# Patient Record
Sex: Female | Born: 1980 | Race: Black or African American | Hispanic: No | Smoking: Current every day smoker
Health system: Southern US, Community
[De-identification: ages and names within clinical notes are randomized; demographics above are authoritative.]

---

## 2021-08-23 ENCOUNTER — Emergency Department (HOSPITAL_COMMUNITY)
Admission: EM | Admit: 2021-08-23 | Discharge: 2021-08-24 | Disposition: A | Discharge status: Home / Self Care | Payer: Medicaid Other | Attending: Emergency Medicine | Admitting: Emergency Medicine

## 2021-08-23 DIAGNOSIS — I959 Hypotension, unspecified: Secondary | ICD-10-CM | POA: Insufficient documentation

## 2021-08-23 DIAGNOSIS — N9489 Other specified conditions associated with female genital organs and menstrual cycle: Secondary | ICD-10-CM | POA: Insufficient documentation

## 2021-08-23 DIAGNOSIS — Z20822 Contact with and (suspected) exposure to covid-19: Secondary | ICD-10-CM | POA: Insufficient documentation

## 2021-08-23 DIAGNOSIS — R0789 Other chest pain: Secondary | ICD-10-CM | POA: Insufficient documentation

## 2021-08-23 DIAGNOSIS — R079 Chest pain, unspecified: Secondary | ICD-10-CM

## 2021-08-23 DIAGNOSIS — R0602 Shortness of breath: Secondary | ICD-10-CM | POA: Insufficient documentation

## 2021-08-23 DIAGNOSIS — D72829 Elevated white blood cell count, unspecified: Secondary | ICD-10-CM | POA: Insufficient documentation

## 2021-08-24 ENCOUNTER — Encounter (HOSPITAL_COMMUNITY): Payer: Self-pay | Admitting: *Deleted

## 2021-08-24 ENCOUNTER — Emergency Department (HOSPITAL_COMMUNITY): Payer: Medicaid Other

## 2021-08-24 ENCOUNTER — Other Ambulatory Visit: Payer: Self-pay

## 2021-08-24 LAB — TROPONIN I (HIGH SENSITIVITY)
Troponin I (High Sensitivity): 3 ng/L (ref <18)
Troponin I (High Sensitivity): 3 ng/L (ref <18)

## 2021-08-24 LAB — CBC WITH DIFFERENTIAL/PLATELET
Abs Immature Granulocytes: 0.06 10*3/uL (ref 0.00–0.07)
Basophils Absolute: 0 10*3/uL (ref 0.0–0.1)
Basophils Relative: 0 %
Eosinophils Absolute: 0 10*3/uL (ref 0.0–0.5)
Eosinophils Relative: 0 %
HCT: 33.5 % — ABNORMAL LOW (ref 36.0–46.0)
Hemoglobin: 10.7 g/dL — ABNORMAL LOW (ref 12.0–15.0)
Immature Granulocytes: 0 %
Lymphocytes Relative: 21 %
Lymphs Abs: 3.5 10*3/uL (ref 0.7–4.0)
MCH: 22.5 pg — ABNORMAL LOW (ref 26.0–34.0)
MCHC: 31.9 g/dL (ref 30.0–36.0)
MCV: 70.4 fL — ABNORMAL LOW (ref 80.0–100.0)
Monocytes Absolute: 0.8 10*3/uL (ref 0.1–1.0)
Monocytes Relative: 4 %
Neutro Abs: 12.7 10*3/uL — ABNORMAL HIGH (ref 1.7–7.7)
Neutrophils Relative %: 75 %
Platelets: 303 10*3/uL (ref 150–400)
RBC: 4.76 MIL/uL (ref 3.87–5.11)
RDW: 16.1 % — ABNORMAL HIGH (ref 11.5–15.5)
WBC: 17 10*3/uL — ABNORMAL HIGH (ref 4.0–10.5)
nRBC: 0 % (ref 0.0–0.2)

## 2021-08-24 LAB — COMPREHENSIVE METABOLIC PANEL
ALT: 10 U/L (ref 0–44)
AST: 19 U/L (ref 15–41)
Albumin: 3.9 g/dL (ref 3.5–5.0)
Alkaline Phosphatase: 63 U/L (ref 38–126)
Anion gap: 8 (ref 5–15)
BUN: 18 mg/dL (ref 6–20)
CO2: 23 mmol/L (ref 22–32)
Calcium: 8.8 mg/dL — ABNORMAL LOW (ref 8.9–10.3)
Chloride: 104 mmol/L (ref 98–111)
Creatinine, Ser: 0.69 mg/dL (ref 0.44–1.00)
GFR, Estimated: >60 mL/min (ref >60)
Glucose, Bld: 87 mg/dL (ref 70–99)
Potassium: 3.7 mmol/L (ref 3.5–5.1)
Sodium: 135 mmol/L (ref 135–145)
Total Bilirubin: 0.7 mg/dL (ref 0.3–1.2)
Total Protein: 6.9 g/dL (ref 6.5–8.1)

## 2021-08-24 LAB — RESP PANEL BY RT-PCR (FLU A&B, COVID) ARPGX2
Influenza A by PCR: NEGATIVE
Influenza B by PCR: NEGATIVE
SARS Coronavirus 2 by RT PCR: NEGATIVE

## 2021-08-24 LAB — I-STAT BETA HCG BLOOD, ED (MC, WL, AP ONLY): I-stat hCG, quantitative: <5 m[IU]/mL (ref <5)

## 2021-08-24 NOTE — ED Notes (Signed)
Pt requesting to leave. States intermittent sharp pain in her chest. Only complaint at the moment is hunger. Given [place] sandwich per MD request.

## 2021-08-24 NOTE — ED Provider Notes (Signed)
[facility] EMERGENCY DEPARTMENT Provider Note   CSN: [id number] Arrival date & time: 08/23/21  2357     History Chief Complaint  Patient presents with   Chest Pain    Lindsay Duffy is a 40 y.o. female.   Chest Pain  Patient presents to the ED for evaluation of sharp left-sided chest pain.  Patient's symptoms initially started a couple hours before she arrived to the ED last evening.  He was on the left side and did not radiate.  She did have some shortness of breath but no nausea vomiting cough congestion or fever.  Patient states her symptoms have improved during her wait to be evaluated.  She has not had any abdominal pain.  No trouble with dysuria.  She denies any numbness or weakness.  She is currently not feeling short of breath.  History reviewed. No pertinent past medical history.  There are no problems to display for this patient.   History reviewed. No pertinent surgical history.   OB History   No obstetric history on file.     No family history on file.     Home Medications Prior to Admission medications   Not on File    Allergies    Patient has no known allergies.  Review of Systems   Review of Systems  Cardiovascular:  Positive for chest pain.  All other systems reviewed and are negative.  Physical Exam Updated Vital Signs BP 104/76   Pulse 67   Temp 98.1 F (36.7 C)   Resp 16   Ht 1.626 m (5\' 4" )   Wt 63.5 kg   LMP 08/24/2021   SpO2 97%   BMI 24.03 kg/m   Physical Exam Vitals and nursing note reviewed.  Constitutional:      General: She is not in acute distress.    Appearance: She is well-developed.  HENT:     Head: Normocephalic and atraumatic.     Right Ear: External ear normal.     Left Ear: External ear normal.  Eyes:     General: No scleral icterus.       Right eye: No discharge.        Left eye: No discharge.     Conjunctiva/sclera: Conjunctivae normal.  Neck:     Trachea: No tracheal deviation.   Cardiovascular:     Rate and Rhythm: Normal rate and regular rhythm.  Pulmonary:     Effort: Pulmonary effort is normal. No respiratory distress.     Breath sounds: Normal breath sounds. No stridor. No wheezing or rales.  Abdominal:     General: Bowel sounds are normal. There is no distension.     Palpations: Abdomen is soft.     Tenderness: There is no abdominal tenderness. There is no guarding or rebound.  Musculoskeletal:        General: No tenderness or deformity.     Cervical back: Neck supple.  Skin:    General: Skin is warm and dry.     Findings: No rash.  Neurological:     General: No focal deficit present.     Mental Status: She is alert.     Cranial Nerves: No cranial nerve deficit (no facial droop, extraocular movements intact, no slurred speech).     Sensory: No sensory deficit.     Motor: No abnormal muscle tone or seizure activity.     Coordination: Coordination normal.  Psychiatric:        Mood and Affect: Mood normal.  ED Results / Procedures / Treatments   Labs (all labs ordered are listed, but only abnormal results are displayed) Labs Reviewed  CBC WITH DIFFERENTIAL/PLATELET - Abnormal; Notable for the following components:      Result Value   WBC 17.0 (*)    Hemoglobin 10.7 (*)    HCT 33.5 (*)    MCV 70.4 (*)    MCH 22.5 (*)    RDW 16.1 (*)    Neutro Abs 12.7 (*)    All other components within normal limits  COMPREHENSIVE METABOLIC PANEL - Abnormal; Notable for the following components:   Calcium 8.8 (*)    All other components within normal limits  RESP PANEL BY RT-PCR (FLU A&B, COVID) [id number]  I-STAT BETA HCG BLOOD, ED ([facility], [facility], [facility] ONLY)  TROPONIN I (HIGH SENSITIVITY)  TROPONIN I (HIGH SENSITIVITY)    EKG EKG Interpretation  Date/Time:  Monday August 24 2021 00:06:15 EST Ventricular Rate:  69 PR Interval:  148 QRS Duration: 82 QT Interval:  414 QTC Calculation: 443 R Axis:   87 Text Interpretation: Normal sinus rhythm Artifact  Otherwise within normal limits Confirmed by Gerhard Munch [phone]) on 08/24/2021 8:24:09 AM  Radiology DG Chest 2 View  Result Date: 08/24/2021 CLINICAL DATA:  Chest pain. EXAM: CHEST - 2 VIEW COMPARISON:  None. FINDINGS: The heart size and mediastinal contours are within normal limits. There is mild hyperinflation of the lungs with coarse interstitial markings. No consolidation, effusion, or pneumothorax. No acute osseous abnormality. IMPRESSION: Hyperinflation of the lungs with no acute process. Electronically Signed   By: Thornell Sartorius M.D.   On: 08/24/2021 01:17    Procedures Procedures   Medications Ordered in ED Medications - No data to display  ED Course  I have reviewed the triage vital signs and the nursing notes.  Pertinent labs & imaging results that were available during my care of the patient were reviewed by me and considered in my medical decision making (see chart for details).    MDM Rules/Calculators/A&P                           Patient presented with complaints of chest pain.  Concern for the possibility of acute coronary syndrome.  Also did consider pneumonia or pulmonary embolism.  Symptoms not suggestive of dissection although still in the differential.  Initial ED work-up prior to my evaluation including laboratory tests as well as x-ray.  Patient does not have any evidence of pneumonia on chest x-ray.  COVID and flu are negative.  Metabolic panel unremarkable.  Patient did have leukocytosis but no obvious signs of infection otherwise.  Serial troponins were negative.  No signs of cardiac ischemia.  Patient's blood pressure was noted to be low this morning.  Discussed my concern about the possibility of pulmonary embolism causing the sharp chest pain and her low blood pressure.  I recommend doing a CT angiogram of the lungs.  I explained my concern about her blood pressure and the possibility of a pulmonary embolism causing her pain and discomfort.  Patient states she  is feeling fine and does not feel that that is necessary.  She states her blood pressure is always low.  I did review previous records from other medical facilities.  On October 7 at [facility] she did have a blood pressure documented at 98/61 and on September 26 it was 93/60.   Final Clinical Impression(s) / ED Diagnoses Final diagnoses:  Chest pain, unspecified type  Hypotension, unspecified hypotension type  Leukocytosis, unspecified type    Rx / DC Orders ED Discharge Orders     None        Linwood Dibbles, MD 08/24/21 [phone]

## 2021-08-24 NOTE — ED Triage Notes (Signed)
The pt arrived by ems from home pt c/o some chest pain  2-3 hours.  Mi in the past year no blood thinners

## 2021-08-24 NOTE — ED Notes (Signed)
Patient Alert and oriented to baseline. Stable and ambulatory to baseline. Patient verbalized understanding of the discharge instructions.  Patient belongings were taken by the patient.   

## 2021-08-24 NOTE — Discharge Instructions (Addendum)
Return to the ER if the symptoms are not improving or if you change your mind about getting the scan that we discussed.  Follow-up with your doctor to be rechecked if the symptoms do not completely resolve within the next couple of days

## 2021-08-24 NOTE — ED Provider Notes (Signed)
MSE was initiated and I personally evaluated the patient and placed orders (if any) at  12:08 AM on August 24, 2021.  Patient with no significant medical history reported to ED by EMS for 2 hours of sharp, left, nonradiating CP, getting progressively worse since onset. Associated with SOB, but no N, V, cough, congestion, fever. She reports sore throat.   RRR Lungs clear No chest wall tenderness  The patient appears stable so that the remainder of the MSE may be completed by another provider.   Elpidio Anis, PA-C 08/24/21 0010    Tilden Fossa, MD 08/24/21 [phone]

## 2021-08-30 ENCOUNTER — Emergency Department (HOSPITAL_COMMUNITY): Payer: 59

## 2021-08-30 ENCOUNTER — Inpatient Hospital Stay (HOSPITAL_COMMUNITY)
Admission: EM | Admit: 2021-08-30 | Discharge: 2021-09-01 | DRG: 176 | Disposition: A | Discharge status: Home / Self Care | Payer: 59 | Attending: Internal Medicine | Admitting: Internal Medicine

## 2021-08-30 ENCOUNTER — Encounter (HOSPITAL_COMMUNITY): Payer: Self-pay

## 2021-08-30 ENCOUNTER — Other Ambulatory Visit: Payer: Self-pay

## 2021-08-30 DIAGNOSIS — I2602 Saddle embolus of pulmonary artery with acute cor pulmonale: Secondary | ICD-10-CM | POA: Diagnosis not present

## 2021-08-30 DIAGNOSIS — Z5900 Homelessness unspecified: Secondary | ICD-10-CM | POA: Diagnosis not present

## 2021-08-30 DIAGNOSIS — I2699 Other pulmonary embolism without acute cor pulmonale: Secondary | ICD-10-CM | POA: Diagnosis present

## 2021-08-30 DIAGNOSIS — Z8616 Personal history of COVID-19: Secondary | ICD-10-CM

## 2021-08-30 DIAGNOSIS — R918 Other nonspecific abnormal finding of lung field: Secondary | ICD-10-CM | POA: Diagnosis present

## 2021-08-30 DIAGNOSIS — D62 Acute posthemorrhagic anemia: Secondary | ICD-10-CM | POA: Diagnosis present

## 2021-08-30 DIAGNOSIS — F17210 Nicotine dependence, cigarettes, uncomplicated: Secondary | ICD-10-CM | POA: Diagnosis present

## 2021-08-30 DIAGNOSIS — I2694 Multiple subsegmental pulmonary emboli without acute cor pulmonale: Secondary | ICD-10-CM | POA: Diagnosis present

## 2021-08-30 DIAGNOSIS — E861 Hypovolemia: Secondary | ICD-10-CM | POA: Diagnosis present

## 2021-08-30 DIAGNOSIS — E871 Hypo-osmolality and hyponatremia: Secondary | ICD-10-CM | POA: Diagnosis present

## 2021-08-30 DIAGNOSIS — Z825 Family history of asthma and other chronic lower respiratory diseases: Secondary | ICD-10-CM | POA: Diagnosis not present

## 2021-08-30 LAB — RESP PANEL BY RT-PCR (FLU A&B, COVID) ARPGX2
Influenza A by PCR: NEGATIVE
Influenza B by PCR: NEGATIVE
SARS Coronavirus 2 by RT PCR: NEGATIVE

## 2021-08-30 LAB — CBC
HCT: 35.5 % — ABNORMAL LOW (ref 36.0–46.0)
Hemoglobin: 11.4 g/dL — ABNORMAL LOW (ref 12.0–15.0)
MCH: 22.8 pg — ABNORMAL LOW (ref 26.0–34.0)
MCHC: 32.1 g/dL (ref 30.0–36.0)
MCV: 70.9 fL — ABNORMAL LOW (ref 80.0–100.0)
Platelets: 317 10*3/uL (ref 150–400)
RBC: 5.01 MIL/uL (ref 3.87–5.11)
RDW: 16.4 % — ABNORMAL HIGH (ref 11.5–15.5)
WBC: 11.7 10*3/uL — ABNORMAL HIGH (ref 4.0–10.5)
nRBC: 0 % (ref 0.0–0.2)

## 2021-08-30 LAB — TROPONIN I (HIGH SENSITIVITY): Troponin I (High Sensitivity): 4 ng/L (ref <18)

## 2021-08-30 LAB — COMPREHENSIVE METABOLIC PANEL
ALT: 15 U/L (ref 0–44)
AST: 25 U/L (ref 15–41)
Albumin: 4.8 g/dL (ref 3.5–5.0)
Alkaline Phosphatase: 72 U/L (ref 38–126)
Anion gap: 9 (ref 5–15)
BUN: 29 mg/dL — ABNORMAL HIGH (ref 6–20)
CO2: 20 mmol/L — ABNORMAL LOW (ref 22–32)
Calcium: 9.1 mg/dL (ref 8.9–10.3)
Chloride: 106 mmol/L (ref 98–111)
Creatinine, Ser: 0.78 mg/dL (ref 0.44–1.00)
GFR, Estimated: >60 mL/min (ref >60)
Glucose, Bld: 100 mg/dL — ABNORMAL HIGH (ref 70–99)
Potassium: 3.7 mmol/L (ref 3.5–5.1)
Sodium: 135 mmol/L (ref 135–145)
Total Bilirubin: 1.2 mg/dL (ref 0.3–1.2)
Total Protein: 7.9 g/dL (ref 6.5–8.1)

## 2021-08-30 LAB — I-STAT BETA HCG BLOOD, ED (MC, WL, AP ONLY): I-stat hCG, quantitative: <5 m[IU]/mL (ref <5)

## 2021-08-30 MED ORDER — THIAMINE HCL 100 MG/ML IJ SOLN: 100.0000 mg | Freq: Every day | INTRAMUSCULAR | Status: DC

## 2021-08-30 MED ORDER — ALBUTEROL SULFATE HFA 108 (90 BASE) MCG/ACT IN AERS
2.0000 | INHALATION_SPRAY | RESPIRATORY_TRACT | Status: DC | PRN
  Administered 2021-08-30: 13:00:00 2 via RESPIRATORY_TRACT
  Filled 2021-08-30: qty 6.7

## 2021-08-30 MED ORDER — ENOXAPARIN SODIUM 80 MG/0.8ML IJ SOSY
1.0000 mg/kg | PREFILLED_SYRINGE | Freq: Two times a day (BID) | INTRAMUSCULAR | Status: DC
Start: 2021-08-31 — End: 2021-09-01
  Administered 2021-08-31 (×2): 65 mg via SUBCUTANEOUS
  Filled 2021-08-30: qty 0.65
  Filled 2021-08-30: qty 0.8
  Filled 2021-08-30 (×2): qty 0.65

## 2021-08-30 MED ORDER — THIAMINE HCL 100 MG PO TABS
100.0000 mg | ORAL_TABLET | Freq: Every day | ORAL | Status: DC
  Administered 2021-08-30 – 2021-08-31 (×2): 100 mg via ORAL
  Filled 2021-08-30 (×2): qty 1

## 2021-08-30 MED ORDER — SODIUM CHLORIDE 0.9 % IV SOLN: INTRAVENOUS | Status: DC

## 2021-08-30 MED ORDER — DIAZEPAM 5 MG PO TABS
10.0000 mg | ORAL_TABLET | Freq: Two times a day (BID) | ORAL | Status: DC
  Administered 2021-09-01: 10 mg via ORAL
  Filled 2021-08-30: qty 2

## 2021-08-30 MED ORDER — LORAZEPAM 1 MG PO TABS: 1.0000 mg | ORAL_TABLET | ORAL | Status: DC | PRN

## 2021-08-30 MED ORDER — GUAIFENESIN 100 MG/5ML PO LIQD: 5.0000 mL | ORAL | Status: DC | PRN

## 2021-08-30 MED ORDER — LORAZEPAM 2 MG/ML IJ SOLN: 1.0000 mg | INTRAMUSCULAR | Status: DC | PRN

## 2021-08-30 MED ORDER — NICOTINE 14 MG/24HR TD PT24
14.0000 mg | MEDICATED_PATCH | Freq: Every day | TRANSDERMAL | Status: DC
  Administered 2021-08-31: 14 mg via TRANSDERMAL
  Filled 2021-08-30: qty 1

## 2021-08-30 MED ORDER — IOHEXOL 350 MG/ML SOLN
80.0000 mL | Freq: Once | INTRAVENOUS | Status: AC | PRN
  Administered 2021-08-30: 16:00:00 80 mL via INTRAVENOUS

## 2021-08-30 MED ORDER — ENOXAPARIN SODIUM 80 MG/0.8ML IJ SOSY
1.0000 mg/kg | PREFILLED_SYRINGE | Freq: Once | INTRAMUSCULAR | Status: AC
  Administered 2021-08-30: 19:00:00 65 mg via SUBCUTANEOUS
  Filled 2021-08-30: qty 0.65

## 2021-08-30 MED ORDER — ADULT MULTIVITAMIN W/MINERALS CH
1.0000 | ORAL_TABLET | Freq: Every day | ORAL | Status: DC
  Administered 2021-08-30 – 2021-08-31 (×2): 1 via ORAL
  Filled 2021-08-30 (×2): qty 1

## 2021-08-30 MED ORDER — FOLIC ACID 1 MG PO TABS
1.0000 mg | ORAL_TABLET | Freq: Every day | ORAL | Status: DC
  Administered 2021-08-30 – 2021-08-31 (×2): 1 mg via ORAL
  Filled 2021-08-30 (×2): qty 1

## 2021-08-30 MED ORDER — DIAZEPAM 5 MG PO TABS: 10.0000 mg | ORAL_TABLET | Freq: Three times a day (TID) | ORAL | Status: DC

## 2021-08-30 MED ORDER — TRAZODONE HCL 50 MG PO TABS: 50.0000 mg | ORAL_TABLET | Freq: Every evening | ORAL | Status: DC | PRN

## 2021-08-30 MED ORDER — DIAZEPAM 5 MG PO TABS
10.0000 mg | ORAL_TABLET | Freq: Three times a day (TID) | ORAL | Status: AC
  Administered 2021-08-30 – 2021-08-31 (×4): 10 mg via ORAL
  Filled 2021-08-30 (×4): qty 2

## 2021-08-30 MED ORDER — IPRATROPIUM-ALBUTEROL 0.5-2.5 (3) MG/3ML IN SOLN
3.0000 mL | Freq: Two times a day (BID) | RESPIRATORY_TRACT | Status: DC
  Filled 2021-08-30 (×2): qty 3

## 2021-08-30 MED ORDER — ACETAMINOPHEN 325 MG PO TABS: 650.0000 mg | ORAL_TABLET | Freq: Four times a day (QID) | ORAL | Status: DC | PRN
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
## 2021-08-30 NOTE — Progress Notes (Signed)
TOC CSW attempted to contact [facility]/WS [phone].  [facility] is currently close will open for services on Monday (08/31/2021).  Lindsay Duffy, MSW, LCSW-A Pronouns:  She/Her/Hers [facility] Clinical Social Worker Direct Number:  [phone] [email or web address]

## 2021-08-30 NOTE — Progress Notes (Signed)
ANTICOAGULATION CONSULT NOTE - Initial Consult  Pharmacy Consult for IV heparin Indication: pulmonary embolus  No Known Allergies  Patient Measurements: Height: 5\' 4"  (162.6 cm) Weight: 64 kg (141 lb) IBW/kg (Calculated) : 54.7 Heparin Dosing Weight: 64 kg  Vital Signs: BP: 102/65 (12/04 1704) Pulse Rate: 54 (12/04 1704)  Labs: Recent Labs    08/30/21 0919  HGB 11.4*  HCT 35.5*  PLT 317  CREATININE 0.78    Estimated Creatinine Clearance: 79.9 mL/min (by C-G formula based on SCr of 0.78 mg/dL).   Medical History: History reviewed. No pertinent past medical history.  Medications:  Scheduled:  Infusions:  PRN: albuterol  Assessment: 40 yo female with CC shortness of breath found to have PE to start IV heparin. Baseline labs drawn.   Goal of Therapy:  Heparin level 0.3-0.7 units/ml Monitor platelets by anticoagulation protocol: Yes   Plan:  IV Heparin 2000 unit bolus then IV Heparin rate of 1150 units/hr Check heparin level 6 hours after start of IV heparin Daily CBC  46 08/30/2021,6:20 PM

## 2021-08-30 NOTE — ED Triage Notes (Signed)
Patient BIB [facility] complaining of shortness of breath the past hour. Patient has COPD.

## 2021-08-30 NOTE — H&P (Signed)
History and Physical  Lindsay Duffy ZOX:[id number] DOB: 07/24/1981 DOA: 08/30/2021  Referring physician: Dr. Achille Rich, EDP  PCP: Pcp, No  Outpatient Specialists: None. Patient coming from: Homeless  Chief Complaint: Shortness of breath  HPI: Lindsay Duffy is a 40 y.o. female with medical history significant for ongoing tobacco use disorder, ongoing alcohol use disorder, COPD, homelessness who presented to [facility] ED with complaints of sudden onset worsening shortness of breath x1 day duration.  Reports she has had prior episodes of shortness of breath but not as severe.  Associated with productive cough with yellow sputum.  No hemoptysis.  Endorses pleuritic left-sided chest pain worse with taking a deep breath.  Recently diagnosed with COVID-19 viral infection 6 weeks ago after she arrived in [place] after a 5 hours trip on the bus from [place].  States she got out of the bus once during that 5-hour trip to smoke, at the 2.5-hour mark.  She smokes cigarettes about a pack a day.  Not on hormonal birth control.  Currently on her menstrual period.  Has noted mild edema in her lower extremities bilaterally, states her socks feel tighter.  Denies fevers or chills.  Upon presentation to the ED, work-up revealed multiple pulmonary emboli and emphysema.  EDP requested admission for further evaluation and management of present condition.  Admitted by hospitalist service.  ED Course: T-max 98.5.  BP 83/64 pulse 64, respiration rate 17, oxygen saturation 100% on room air.  Review of Systems: Review of systems as noted in the HPI. All other systems reviewed and are negative.  Past medical history: COPD.  Past surgical history: None reported.  Social history: Tobacco use, 1 pack/day. Drinks 1 pint of liquor and beers daily, last alcoholic drink was last night 81/09/9145. Denies use of illicit drugs.   No Known Allergies  Family history: Father with history of COPD and asthma.  Prior to  admission medications: None  Physical Exam: BP 102/65   Pulse (!) 54   Temp 98.5 F (36.9 C) (Oral)   Resp 16   Ht 5\' 4"  (1.626 m)   Wt 64 kg   LMP 08/24/2021   SpO2 100%   BMI 24.20 kg/m   General: 40 y.o. year-old female well developed well nourished in no acute distress.  Alert and oriented x3. Cardiovascular: Regular rate and rhythm with no rubs or gallops.  No thyromegaly or JVD noted.  Trace lower extremity edema. 2/4 pulses in all 4 extremities. Respiratory: Clear to auscultation with no wheezes or rales. Good inspiratory effort.  Left-sided pleuritic pain but deep breath. Abdomen: Soft nontender nondistended with normal bowel sounds x4 quadrants. Muskuloskeletal: No cyanosis or clubbing.  Trace edema noted bilaterally Neuro: CN II-XII intact, strength, sensation, reflexes Skin: No ulcerative lesions noted or rashes Psychiatry: Judgement and insight appear normal. Mood is appropriate for condition and setting          Labs on Admission:  Basic Metabolic Panel: Recent Labs  Lab 08/24/21 0009 08/30/21 0919  NA 135 135  K 3.7 3.7  CL 104 106  CO2 23 20*  GLUCOSE 87 100*  BUN 18 29*  CREATININE 0.69 0.78  CALCIUM 8.8* 9.1   Liver Function Tests: Recent Labs  Lab 08/24/21 0009 08/30/21 0919  AST 19 25  ALT 10 15  ALKPHOS 63 72  BILITOT 0.7 1.2  PROT 6.9 7.9  ALBUMIN 3.9 4.8   No results for input(s): LIPASE, AMYLASE in the last 168 hours. No results for input(s):  AMMONIA in the last 168 hours. CBC: Recent Labs  Lab 08/24/21 0009 08/30/21 0919  WBC 17.0* 11.7*  NEUTROABS 12.7*  --   HGB 10.7* 11.4*  HCT 33.5* 35.5*  MCV 70.4* 70.9*  PLT 303 317   Cardiac Enzymes: No results for input(s): CKTOTAL, CKMB, CKMBINDEX, TROPONINI in the last 168 hours.  BNP (last 3 results) No results for input(s): BNP in the last 8760 hours.  ProBNP (last 3 results) No results for input(s): PROBNP in the last 8760 hours.  CBG: No results for input(s): GLUCAP in  the last 168 hours.  Radiological Exams on Admission: DG Chest 2 View  Result Date: 08/30/2021 CLINICAL DATA:  Shortness of breath for 1 hour EXAM: CHEST - 2 VIEW COMPARISON:  08/24/2021 FINDINGS: The heart size and mediastinal contours are within normal limits. Both lungs are hyperinflated but clear. The visualized skeletal structures are unremarkable. IMPRESSION: COPD without acute abnormality. Electronically Signed   By: Inez Catalina M.D.   On: 08/30/2021 02:02   CT Angio Chest PE W and/or Wo Contrast  Result Date: 08/30/2021 CLINICAL DATA:  Shortness of breath EXAM: CT ANGIOGRAPHY CHEST WITH CONTRAST TECHNIQUE: Multidetector CT imaging of the chest was performed using the standard protocol during bolus administration of intravenous contrast. Multiplanar CT image reconstructions and MIPs were obtained to evaluate the vascular anatomy. CONTRAST:  6mL OMNIPAQUE IOHEXOL 350 MG/ML SOLN COMPARISON:  Chest radiograph 08/30/2021 FINDINGS: Cardiovascular: There are around 11 foci of mostly subsegmental pulmonary embolus in the lungs. Several of these have chronic characteristics but some are likely acute. Because not of the embolus is lobar or greater, this does not qualify for measurement of right ventricular to left ventricular ratio. Mediastinum/Nodes: Unremarkable Lungs/Pleura: Centrilobular and paraseptal emphysema. Sub solid right upper lobe pulmonary nodule measuring 5 by 5 by 5 mm (volume = 70 mm^3) on image 58 series 7. Sub solid right middle lobe nodule measuring 9 by 5 by 4 mm (volume = 90 mm^3) on image 94 series 7. Sub solid 3 mm right lower lobe nodule on image 104 series 7. Sub solid 3 mm right lower lobe nodule on image 69 series 7. Sub solid 5 by 3 by 2 mm (volume = 20 mm^3) left upper lobe nodule on image 48 series 7. Sub solid 7 by 4 by 5 mm (volume = 70 mm^3) superior segment left lower lobe nodule on image 59 series 7. Several other tiny sub solid nodules are present. Upper Abdomen:  Nonspecific 5 mm hypodense lesion in the dome of the liver on image 109 series 5. Musculoskeletal: Unremarkable Review of the MIP images confirms the above findings. IMPRESSION: 1. I identify 11 small foci of primarily subsegmental pulmonary embolus, some of these appears central and are likely acute or subacute, but some of the filling defects are peripheral in the vessel indicating that at least some of these represent chronic pulmonary embolus. None of the embolus is lobar or greater in size and accordingly RV to LV ratio is not assessed. 2. Several scattered sub solid pulmonary nodules, largest 90 cubic mm volume. Non-contrast chest CT at 3-6 months is recommended. If the nodules are stable at time of repeat CT, then future CT at 18-24 months (from today's scan) is considered optional for low-risk patients, but is recommended for high-risk patients. This recommendation follows the consensus statement: Guidelines for Management of Incidental Pulmonary Nodules Detected on CT Images: From the Fleischner Society 2017; Radiology 2017; 284:228-243. 3.  Emphysema (ICD10-J43.9). These results were called  by telephone at the time of interpretation on 08/30/2021 at 5:03 pm to provider RILEY RANSOM , who verbally acknowledged these results. Electronically Signed   By: Van Clines M.D.   On: 08/30/2021 17:05    EKG: I independently viewed the EKG done and my findings are as followed: Sinus rhythm rate of 64.  Nonspecific ST-T changes.  QTc 412.  Assessment/Plan Present on Admission:  Acute pulmonary embolism (HCC)  Principal Problem:   Acute pulmonary embolism (HCC)  Multiple bilateral pulmonary emboli acute and subacute, POA, seen on CT scan Current smoker, recent 5-hour trip, COVID infection a month ago. Obtain troponin and BNP for risk stratification Factor V Leiden Start low-dose subcu Lovenox twice daily Will need 3 to 6 months of oral anticoagulation Follow 2D echo  Multiple lung  nodules Ongoing tobacco use CT will need to be repeated, noncontrast CT in 3 to 6 months. Tobacco cessation counseled on at bedside.  Chronic microcytic anemia with acute blood loss in the setting of menstruation Currently on her menstrual period Hemoglobin 11.4 Monitor H&H while on anticoagulation  Tobacco use disorder Provide tobacco cessation counseling at bedside Patient is receptive and wants to quit tobacco use Start nicotine patch  Alcohol use disorder Heavy drinker pints of liquor and beers daily High risk for alcohol withdrawal Last alcoholic drink was on [id number] evening Start CIWA protocol    DVT prophylaxis: Full dose subcu Lovenox twice daily  Code Status: Full code  Family Communication: Husband at bedside  Disposition Plan: Will likely discharge to home once medications are arranged with safe discharge planning due to homelessness.  Consults called: None  Admission status: Inpatient status.  Patient will require at least 2 midnights for further evaluation and treatment of present condition.   Status is: Inpatient   Kayleen Memos MD [facility] Pager [phone]  If 7PM-7AM, please contact night-coverage [email or web address] Password [email or web address]  08/30/2021, 5:51 PM

## 2021-08-30 NOTE — Discharge Instructions (Addendum)
Information on my medicine - XARELTO (rivaroxaban)  WHY WAS XARELTO PRESCRIBED FOR YOU? Xarelto was prescribed to treat blood clots that may have been found in the veins of your legs (deep vein thrombosis) or in your lungs (pulmonary embolism) and to reduce the risk of them occurring again.  What do you need to know about Xarelto? The starting dose is one 15 mg tablet taken TWICE daily with food for the FIRST 21 DAYS then on (enter date)  09/22/21  the dose is changed to one 20 mg tablet taken ONCE A DAY with your evening meal.  DO NOT stop taking Xarelto without talking to the health care provider who prescribed the medication.  Refill your prescription for 20 mg tablets before you run out.  After discharge, you should have regular check-up appointments with your healthcare provider that is prescribing your Xarelto.  In the future your dose may need to be changed if your kidney function changes by a significant amount.  What do you do if you miss a dose? If you are taking Xarelto TWICE DAILY and you miss a dose, take it as soon as you remember. You may take two 15 mg tablets (total 30 mg) at the same time then resume your regularly scheduled 15 mg twice daily the next day.  If you are taking Xarelto ONCE DAILY and you miss a dose, take it as soon as you remember on the same day then continue your regularly scheduled once daily regimen the next day. Do not take two doses of Xarelto at the same time.   Important Safety Information Xarelto is a blood thinner medicine that can cause bleeding. You should call your healthcare provider right away if you experience any of the following: Bleeding from an injury or your nose that does not stop. Unusual colored urine (red or dark brown) or unusual colored stools (red or black). Unusual bruising for unknown reasons. A serious fall or if you hit your head (even if there is no bleeding).  Some medicines may interact with Xarelto and might  increase your risk of bleeding while on Xarelto. To help avoid this, consult your healthcare provider or pharmacist prior to using any new prescription or non-prescription medications, including herbals, vitamins, non-steroidal anti-inflammatory drugs (NSAIDs) and supplements.  This website has more information on Xarelto: VisitDestination.com.br.

## 2021-08-30 NOTE — ED Notes (Signed)
Writer went into patients room to draw labs and noticed patient was irritated. Writer tried talking to patient to understand what was wrong and to help, however patient stated to writer " Do whatever you need to do and get out, and stay out of my business" Patient significant other tried to calm patient down, however patient became louder and told everyone to leave. PA was notified.

## 2021-08-30 NOTE — Progress Notes (Addendum)
Transition of Care [facility]) - Emergency Department Mini Assessment   Patient Details  Name: Lindsay Duffy MRN: [id number] Date of Birth: 04/09/1981  Transition of Care [facility]) CM/SW Contact:    Giovanni Bath C Tarpley-Carter, LCSWA Phone Number: 08/30/2021, 4:23 PM   Clinical Narrative: [facility] CSW consulted with pt about need.  Pt is in need of an emergency shelter and substance rehab (alcoholism).  Antonios Ostrow Tarpley-Carter, MSW, LCSW-A Pronouns:  She/Her/Hers [facility] Clinical Social Worker Direct Number:  [phone] [email or web address]  ED Mini Assessment: What brought you to the Emergency Department? : Shortness of Breath  Barriers to Discharge: No Barriers Identified     Means of departure: Not know  Interventions which prevented an admission or readmission: Other (must enter comment), Homeless Screening (Substance rehab)    Patient Contact and Communications       Contact Date: 08/30/21,       Call outcome: Pt is experiencing homelessness and is in need of substance rehab (alcoholism).  Patient states their goals for this hospitalization and ongoing recovery are:: Pt is seeking substance rehab (alcoholism).   Choice offered to / list presented to : NA  Admission diagnosis:  sob There are no problems to display for this patient.  PCP:  Pcp, No Pharmacy:  No Pharmacies Listed

## 2021-08-30 NOTE — Progress Notes (Signed)
ANTICOAGULATION CONSULT NOTE - Initial Consult  Pharmacy Consult for Lovenox Indication: pulmonary embolus  No Known Allergies  Patient Measurements: Height: 5\' 4"  (162.6 cm) Weight: 64 kg (141 lb) IBW/kg (Calculated) : 54.7 Heparin Dosing Weight: 64 kg  Vital Signs: BP: 102/65 (12/04 1704) Pulse Rate: 54 (12/04 1704)  Labs: Recent Labs    08/30/21 0919  HGB 11.4*  HCT 35.5*  PLT 317  CREATININE 0.78     Estimated Creatinine Clearance: 79.9 mL/min (by C-G formula based on SCr of 0.78 mg/dL).   Medical History: History reviewed. No pertinent past medical history.  Medications:  Scheduled:  Infusions:  PRN: albuterol  Assessment: 40 yo female with CC shortness of breath found to have PE to start Lovenox. Baseline labs drawn  Goal of Therapy:  Heparin level 0.6-1.1 units/mL Monitor platelets by anticoagulation protocol: Yes   Plan:  Lovenox 1mg /kg SQ q12 Daily CBC  46 08/30/2021,6:24 PM

## 2021-08-30 NOTE — Progress Notes (Signed)
TOC CSW also contacted :  [facility] [phone]--Call back on Monday (08/31/2021)  [facility] [phone]--closed at 3pm today, call back on Monday (08/31/2021)  [facility] [phone]--does not accept Medicaid  Naphtali Zywicki Tarpley-Carter, MSW, LCSW-A Pronouns:  She/Her/Hers [facility] Clinical Social Worker Direct Number:  [phone] [email or web address]

## 2021-08-30 NOTE — Progress Notes (Signed)
TOC CSW spoke with pt and husband at bedside.  Pt is in need of assistance with substance rehab.  Pt is stating she has Medicaid.  Pt and husband are from [place].  Ruqayyah Lute Tarpley-Carter, MSW, LCSW-A Pronouns:  She/Her/Hers [facility] Clinical Social Worker Direct Number:  [phone] [email or web address]

## 2021-08-31 ENCOUNTER — Encounter (HOSPITAL_COMMUNITY): Payer: Self-pay | Admitting: Internal Medicine

## 2021-08-31 ENCOUNTER — Inpatient Hospital Stay (HOSPITAL_COMMUNITY): Payer: 59

## 2021-08-31 DIAGNOSIS — I2602 Saddle embolus of pulmonary artery with acute cor pulmonale: Secondary | ICD-10-CM

## 2021-08-31 DIAGNOSIS — I2699 Other pulmonary embolism without acute cor pulmonale: Secondary | ICD-10-CM

## 2021-08-31 LAB — IRON AND TIBC
Iron: 43 ug/dL (ref 28–170)
Saturation Ratios: 15 % (ref 10.4–31.8)
TIBC: 280 ug/dL (ref 250–450)
UIBC: 237 ug/dL

## 2021-08-31 LAB — ECHOCARDIOGRAM COMPLETE
Area-P 1/2: 3.91 cm2
Calc EF: 66.5 %
Height: 64 in
S' Lateral: 3.3 cm
Single Plane A2C EF: 69.2 %
Single Plane A4C EF: 64.7 %
Weight: 2256 oz

## 2021-08-31 LAB — COMPREHENSIVE METABOLIC PANEL
ALT: 15 U/L (ref 0–44)
AST: 29 U/L (ref 15–41)
Albumin: 3.6 g/dL (ref 3.5–5.0)
Alkaline Phosphatase: 57 U/L (ref 38–126)
Anion gap: 3 — ABNORMAL LOW (ref 5–15)
BUN: 17 mg/dL (ref 6–20)
CO2: 24 mmol/L (ref 22–32)
Calcium: 8.3 mg/dL — ABNORMAL LOW (ref 8.9–10.3)
Chloride: 106 mmol/L (ref 98–111)
Creatinine, Ser: 0.71 mg/dL (ref 0.44–1.00)
GFR, Estimated: >60 mL/min (ref >60)
Glucose, Bld: 86 mg/dL (ref 70–99)
Potassium: 4.1 mmol/L (ref 3.5–5.1)
Sodium: 133 mmol/L — ABNORMAL LOW (ref 135–145)
Total Bilirubin: 0.9 mg/dL (ref 0.3–1.2)
Total Protein: 6.5 g/dL (ref 6.5–8.1)

## 2021-08-31 LAB — CBC
HCT: 31.9 % — ABNORMAL LOW (ref 36.0–46.0)
Hemoglobin: 10 g/dL — ABNORMAL LOW (ref 12.0–15.0)
MCH: 22.6 pg — ABNORMAL LOW (ref 26.0–34.0)
MCHC: 31.3 g/dL (ref 30.0–36.0)
MCV: 72.2 fL — ABNORMAL LOW (ref 80.0–100.0)
Platelets: 247 10*3/uL (ref 150–400)
RBC: 4.42 MIL/uL (ref 3.87–5.11)
RDW: 16.4 % — ABNORMAL HIGH (ref 11.5–15.5)
WBC: 6.9 10*3/uL (ref 4.0–10.5)
nRBC: 0 % (ref 0.0–0.2)

## 2021-08-31 LAB — PHOSPHORUS: Phosphorus: 3.5 mg/dL (ref 2.5–4.6)

## 2021-08-31 LAB — FERRITIN: Ferritin: 38 ng/mL (ref 11–307)

## 2021-08-31 LAB — HIV ANTIBODY (ROUTINE TESTING W REFLEX): HIV Screen 4th Generation wRfx: NONREACTIVE

## 2021-08-31 LAB — MAGNESIUM: Magnesium: 2 mg/dL (ref 1.7–2.4)

## 2021-08-31 MED ORDER — FERROUS SULFATE 325 (65 FE) MG PO TABS: 325.0000 mg | ORAL_TABLET | Freq: Every day | ORAL | Status: DC
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
## 2021-08-31 NOTE — Progress Notes (Signed)
  Echocardiogram 2D Echocardiogram has been performed.  Janalyn Harder 08/31/2021, 2:32 PM

## 2021-08-31 NOTE — Progress Notes (Signed)
Bilateral lower extremity venous duplex has been completed. Preliminary results can be found in CV Proc through chart review.   08/31/21 8:58 AM Olen Cordial RVT

## 2021-09-01 ENCOUNTER — Other Ambulatory Visit (HOSPITAL_COMMUNITY): Payer: Self-pay

## 2021-09-01 LAB — CBC
HCT: 29.8 % — ABNORMAL LOW (ref 36.0–46.0)
Hemoglobin: 9.6 g/dL — ABNORMAL LOW (ref 12.0–15.0)
MCH: 23 pg — ABNORMAL LOW (ref 26.0–34.0)
MCHC: 32.2 g/dL (ref 30.0–36.0)
MCV: 71.5 fL — ABNORMAL LOW (ref 80.0–100.0)
Platelets: 232 10*3/uL (ref 150–400)
RBC: 4.17 MIL/uL (ref 3.87–5.11)
RDW: 16.2 % — ABNORMAL HIGH (ref 11.5–15.5)
WBC: 6.9 10*3/uL (ref 4.0–10.5)
nRBC: 0 % (ref 0.0–0.2)

## 2021-09-01 MED ORDER — RIVAROXABAN (XARELTO) VTE STARTER PACK (15 & 20 MG)
ORAL_TABLET | ORAL | 2 refills | Status: AC
  Filled 2021-09-01: qty 51, 28d supply, fill #0

## 2021-09-01 MED ORDER — FERROUS SULFATE 325 (65 FE) MG PO TABS: 325.0000 mg | ORAL_TABLET | Freq: Every day | ORAL | 0 refills | Status: DC

## 2021-09-01 MED ORDER — FOLIC ACID 1 MG PO TABS
1.0000 mg | ORAL_TABLET | Freq: Every day | ORAL | 0 refills | Status: AC
Start: 2021-09-01 — End: 2021-11-30
  Filled 2021-09-01: qty 90, 90d supply, fill #0

## 2021-09-01 MED ORDER — RIVAROXABAN (XARELTO) VTE STARTER PACK (15 & 20 MG): ORAL_TABLET | ORAL | 2 refills | Status: DC

## 2021-09-01 MED ORDER — NICOTINE 14 MG/24HR TD PT24
14.0000 mg | MEDICATED_PATCH | Freq: Every day | TRANSDERMAL | 0 refills | Status: AC
  Filled 2021-09-01: qty 28, 28d supply, fill #0

## 2021-09-01 MED ORDER — FOLIC ACID 1 MG PO TABS: 1.0000 mg | ORAL_TABLET | Freq: Every day | ORAL | 0 refills | Status: DC

## 2021-09-01 MED ORDER — ADULT MULTIVITAMIN W/MINERALS CH
1.0000 | ORAL_TABLET | Freq: Every day | ORAL | 0 refills | Status: AC
Start: 2021-09-01 — End: 2021-11-30
  Filled 2021-09-01: qty 90, 90d supply, fill #0

## 2021-09-01 MED ORDER — RIVAROXABAN 20 MG PO TABS: 20.0000 mg | ORAL_TABLET | Freq: Every day | ORAL | Status: DC

## 2021-09-01 MED ORDER — NICOTINE 14 MG/24HR TD PT24: 14.0000 mg | MEDICATED_PATCH | Freq: Every day | TRANSDERMAL | 0 refills | Status: DC

## 2021-09-01 MED ORDER — FERROUS SULFATE 325 (65 FE) MG PO TABS
325.0000 mg | ORAL_TABLET | Freq: Every day | ORAL | 0 refills | Status: AC
  Filled 2021-09-01: qty 30, 30d supply, fill #0

## 2021-09-01 MED ORDER — RIVAROXABAN 15 MG PO TABS
15.0000 mg | ORAL_TABLET | Freq: Two times a day (BID) | ORAL | Status: DC
  Administered 2021-09-01: 15 mg via ORAL
  Filled 2021-09-01: qty 1

## 2021-09-01 MED ORDER — ADULT MULTIVITAMIN W/MINERALS CH: 1.0000 | ORAL_TABLET | Freq: Every day | ORAL | 0 refills | Status: DC

## 2021-09-01 MED ORDER — THIAMINE HCL 100 MG PO TABS: 100.0000 mg | ORAL_TABLET | Freq: Every day | ORAL | 0 refills | Status: DC

## 2021-09-01 MED ORDER — THIAMINE HCL 100 MG PO TABS
100.0000 mg | ORAL_TABLET | Freq: Every day | ORAL | 0 refills | Status: AC
  Filled 2021-09-01: qty 90, 90d supply, fill #0
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
## 2021-09-01 NOTE — TOC Initial Note (Signed)
Transition of Care [facility]) - Initial/Assessment Note    Patient Details  Name: Lindsay Duffy MRN: [id number] Date of Birth: [birth year]  Transition of Care [facility]) CM/SW Contact:    Bartholome Bill, RN Phone Number: 09/01/2021, 10:04 AM  Clinical Narrative:                  [facility] consult for pt discharging on anticoagulant. Pt has insurance and Pharmacist is providing 30day free Xarelto card. [facility] info was placed on AVS for hospital follow up. Pt has had security called on her and her guest today as pt has been very aggressive. Unable to see in person as security is escorting pt and guest out of the building. Pt was seen by CSW in the ED on 12/4. Please see note.   Barriers to Discharge: No Barriers Identified   Patient Goals and CMS Choice Patient states their goals for this hospitalization and ongoing recovery are:: Pt is seeking substance rehab (alcoholism).   Choice offered to / list presented to : NA  Expected Discharge Plan and Services        Expected Discharge Date: 09/01/21                   Activities of Daily Living Home Assistive Devices/Equipment: None ADL Screening (condition at time of admission) Patient's cognitive ability adequate to safely complete daily activities?: Yes Is the patient deaf or have difficulty hearing?: No Does the patient have difficulty seeing, even when wearing glasses/contacts?: No Does the patient have difficulty concentrating, remembering, or making decisions?: No Patient able to express need for assistance with ADLs?: Yes Does the patient have difficulty dressing or bathing?: No Independently performs ADLs?: Yes (appropriate for developmental age) Does the patient have difficulty walking or climbing stairs?: Yes (secondary to shortness of breath) Weakness of Legs: None Weakness of Arms/Hands: None  Permission Sought/Granted                  Emotional Assessment              Admission diagnosis:  Acute pulmonary embolism  (HCC) [I26.99] Multiple subsegmental pulmonary emboli without acute cor pulmonale (HCC) [I26.94] Patient Active Problem List   Diagnosis Date Noted   Acute pulmonary embolism (HCC) 08/30/2021   PCP:  Pcp, No Pharmacy:   [id number] - [place], [place] - [address] AT [address] [place] [address] Phone: [phone] Fax: [phone]  [facility] [address] [place] [address] Phone: [phone] Fax: [phone]     Social Determinants of Health (SDOH) Interventions    Readmission Risk Interventions No flowsheet data found.

## 2021-09-01 NOTE — Discharge Summary (Signed)
Discharge Summary  Lindsay Duffy ZOX:[id number] DOB: [birth year]  PCP: Pcp, No  Admit date: 08/30/2021 Discharge date: 09/01/2021  Time spent: 35 minutes  Recommendations for Outpatient Follow-up:  Follow-up with your PCP Take your medications as prescribed Abstain from tobacco use and alcohol use.  Discharge Diagnoses:  Active Hospital Problems   Diagnosis Date Noted   Acute pulmonary embolism (HCC) 08/30/2021    Resolved Hospital Problems  No resolved problems to display.    Discharge Condition: Stable  Diet recommendation: Resume previous diet  Vitals:   09/01/21 0209 09/01/21 0540  BP: 99/65 (!) 85/51  Pulse: (!) 56 61  Resp: 16 17  Temp: 98.8 F (37.1 C) 98.9 F (37.2 C)  SpO2: 100% 100%    History of present illness:  Lindsay Duffy is a 40 y.o. female with medical history significant for tobacco use disorder, alcohol use disorder, COPD, homelessness who presented to [facility] ED with complaints of sudden onset worsening shortness of breath x1 day duration.  Associated with pleuritic left-sided chest pain worse with taking a deep breath.  Upon presentation to the ED, work-up revealed multiple pulmonary emboli, several scattered sub solid pulmonary nodules, and emphysema.  EDP requested admission for further evaluation and management.  Admitted by hospitalist service.   09/01/21:  No acute events overnight.-Had an intense situation this morning with her husband.  Hospital Course:  Principal Problem:   Acute pulmonary embolism (HCC)  Multiple bilateral pulmonary emboli acute and subacute, POA, seen on CTA chest. Current smoker, recent 5-hour trip, COVID infection a month ago. Troponin normal Factor V Leiden pending Received low-dose subcu Lovenox twice daily Switched to Xarelto on 09/01/2021. Will need 3 to 6 months of oral anticoagulation 2D echo 08/31/2021 no reported right heart strain, LVEF 55 to 60%. Follow-up with your PCP.   Multiple lung nodules Advised to  quit smoking. CT will need to be repeated, noncontrast CT in 3 to 6 months. Tobacco cessation counseled on at bedside.   Chronic microcytic anemia with acute blood loss in the setting of menstruation Currently on her menstrual period Follow-up with your PCP   Acute blood loss anemia in the setting of menstruation while on anticoagulation Ferrous sulfate 325 mg daily x30 days Follow-up with your PCP  Tobacco use disorder Tobacco cessation counseling at bedside Nicotine patch   Alcohol use disorder Heavy drinker pints of liquor and beers daily High risk for alcohol withdrawal Last alcoholic drink was on 08/29/2021 evening Received CIWA protocol No signs of alcohol withdrawal.   Hypovolemic hyponatremia Serum sodium 133 Received normal saline at 75 cc/h Follow-up with your PCP.        Code Status: Full code   Family Communication: Husband at bedside    Consults called: None   Discharge Exam: BP (!) 85/51 (BP Location: Left Arm)   Pulse 61   Temp 98.9 F (37.2 C) (Oral)   Resp 17   Ht  (1.626 m)   Wt 64 kg   LMP 08/24/2021   SpO2 100%   BMI 24.20 kg/m  General: 40 y.o. year-old female well developed well nourished in no acute distress.  Alert and oriented x3. Cardiovascular: Regular rate and rhythm with no rubs or gallops.  No thyromegaly or JVD noted.   Respiratory: Clear to auscultation with no wheezes or rales. Good inspiratory effort. Abdomen: Soft nontender nondistended with normal bowel sounds x4 quadrants. Musculoskeletal: No lower extremity edema. 2/4 pulses in all 4 extremities. Skin: No ulcerative lesions noted or rashes,  Psychiatry: Mood is appropriate for condition and setting  Discharge Instructions You were cared for by a hospitalist during your hospital stay. If you have any questions about your discharge medications or the care you received while you were in the hospital after you are discharged, you can call the unit and asked to speak with  the hospitalist on call if the hospitalist that took care of you is not available. Once you are discharged, your primary care physician will handle any further medical issues. Please note that NO REFILLS for any discharge medications will be authorized once you are discharged, as it is imperative that you return to your primary care physician (or establish a relationship with a primary care physician if you do not have one) for your aftercare needs so that they can reassess your need for medications and monitor your lab values.   Allergies as of 09/01/2021       Reactions   Pork-derived Products         Medication List     STOP taking these medications    amphetamine-dextroamphetamine 30 MG tablet Commonly known as: ADDERALL   ARIPiprazole 5 MG tablet Commonly known as: ABILIFY   divalproex 250 MG DR tablet Commonly known as: DEPAKOTE   hydrOXYzine 50 MG capsule Commonly known as: VISTARIL   megestrol 40 MG tablet Commonly known as: MEGACE   naproxen sodium 220 MG tablet Commonly known as: ALEVE   prazosin 2 MG capsule Commonly known as: MINIPRESS       TAKE these medications    buprenorphine-naloxone 8-2 mg Subl SL tablet Commonly known as: SUBOXONE Place 1 tablet under the tongue in the morning, at noon, and at bedtime.   Calcium + Vitamin D3 500-10 MG-MCG Chew Generic drug: Calcium Carb-Cholecalciferol Chew 3 tablets by mouth daily.   ferrous sulfate 325 (65 FE) MG tablet Take 1 tablet (325 mg total) by mouth daily with breakfast.   folic acid 1 MG tablet Commonly known as: FOLVITE Take 1 tablet (1 mg total) by mouth daily.   MIDOL PO Take 650 mg by mouth daily as needed (cramps).   MULTIVITAMIN ADULTS PO Take 1 tablet by mouth daily.   multivitamin with minerals Tabs tablet Take 1 tablet by mouth daily.   nicotine 14 mg/24hr patch Commonly known as: NICODERM CQ - dosed in mg/24 hours Place 1 patch (14 mg total) onto the skin daily.    Rivaroxaban Stater Pack (15 mg and 20 mg) Commonly known as: XARELTO STARTER PACK Follow package directions: Take 1 ( ) tablet by mouth twice a day. On day 22, switch to 1 ( ) tablet once a day. Take with food.   thiamine 100 MG tablet Take 1 tablet (100 mg total) by mouth daily.   Vitamin C Chew Chew 1 tablet by mouth daily.       Allergies  Allergen Reactions   Pork-Derived Products     Follow-up Information     [facility]. Call today.   Why: Please call for a post hospital follow-up appointment. Contact information: [address] [place] [address] [phone]        [facility] Follow up.   Contact information: [phone]                 The results of significant diagnostics from this hospitalization (including imaging, microbiology, ancillary and laboratory) are listed below for reference.    Significant Diagnostic Studies: DG Chest 2 View  Result  Date: 08/30/2021 CLINICAL DATA:  Shortness of breath for 1 hour EXAM: CHEST - 2 VIEW COMPARISON:  08/24/2021 FINDINGS: The heart size and mediastinal contours are within normal limits. Both lungs are hyperinflated but clear. The visualized skeletal structures are unremarkable. IMPRESSION: COPD without acute abnormality. Electronically Signed   By: Alcide Clever M.D.   On: 08/30/2021 02:02   DG Chest 2 View  Result Date: 08/24/2021 CLINICAL DATA:  Chest pain. EXAM: CHEST - 2 VIEW COMPARISON:  None. FINDINGS: The heart size and mediastinal contours are within normal limits. There is mild hyperinflation of the lungs with coarse interstitial markings. No consolidation, effusion, or pneumothorax. No acute osseous abnormality. IMPRESSION: Hyperinflation of the lungs with no acute process. Electronically Signed   By: Thornell Sartorius M.D.   On: 08/24/2021 01:17   CT Angio Chest PE W and/or Wo Contrast  Result Date: 08/30/2021 CLINICAL DATA:   Shortness of breath EXAM: CT ANGIOGRAPHY CHEST WITH CONTRAST TECHNIQUE: Multidetector CT imaging of the chest was performed using the standard protocol during bolus administration of intravenous contrast. Multiplanar CT image reconstructions and MIPs were obtained to evaluate the vascular anatomy. CONTRAST:  80mL OMNIPAQUE IOHEXOL 350 MG/ML SOLN COMPARISON:  Chest radiograph 08/30/2021 FINDINGS: Cardiovascular: There are around 11 foci of mostly subsegmental pulmonary embolus in the lungs. Several of these have chronic characteristics but some are likely acute. Because not of the embolus is lobar or greater, this does not qualify for measurement of right ventricular to left ventricular ratio. Mediastinum/Nodes: Unremarkable Lungs/Pleura: Centrilobular and paraseptal emphysema. Sub solid right upper lobe pulmonary nodule measuring 5 by 5 by 5 mm (volume = 70 mm^3) on image 58 series 7. Sub solid right middle lobe nodule measuring 9 by 5 by 4 mm (volume = 90 mm^3) on image 94 series 7. Sub solid 3 mm right lower lobe nodule on image 104 series 7. Sub solid 3 mm right lower lobe nodule on image 69 series 7. Sub solid 5 by 3 by 2 mm (volume = 20 mm^3) left upper lobe nodule on image 48 series 7. Sub solid 7 by 4 by 5 mm (volume = 70 mm^3) superior segment left lower lobe nodule on image 59 series 7. Several other tiny sub solid nodules are present. Upper Abdomen: Nonspecific 5 mm hypodense lesion in the dome of the liver on image 109 series 5. Musculoskeletal: Unremarkable Review of the MIP images confirms the above findings. IMPRESSION: 1. I identify 11 small foci of primarily subsegmental pulmonary embolus, some of these appears central and are likely acute or subacute, but some of the filling defects are peripheral in the vessel indicating that at least some of these represent chronic pulmonary embolus. None of the embolus is lobar or greater in size and accordingly RV to LV ratio is not assessed. 2. Several  scattered sub solid pulmonary nodules, largest 90 cubic mm volume. Non-contrast chest CT at 3-6 months is recommended. If the nodules are stable at time of repeat CT, then future CT at 18-24 months (from today's scan) is considered optional for low-risk patients, but is recommended for high-risk patients. This recommendation follows the consensus statement: Guidelines for Management of Incidental Pulmonary Nodules Detected on CT Images: From the Fleischner Society 2017; Radiology 2017; 284:228-243. 3.  Emphysema (ICD10-J43.9). These results were called by telephone at the time of interpretation on 08/30/2021 at 5:03 pm to provider RILEY RANSOM , who verbally acknowledged these results. Electronically Signed   By: Gaylyn Rong M.D.   On:  08/30/2021 17:05   ECHOCARDIOGRAM COMPLETE  Result Date: 08/31/2021    ECHOCARDIOGRAM REPORT   Patient Name:   Abbegale Jhaveri Date of Exam: 08/31/2021 Medical Rec #:  [id number]    Height:       64.0 in Accession #:    [id number]   Weight:       141.0 lb Date of Birth:  [birth year]    BSA:          1.686 m Patient Age:    40 years     BP:           108/73 mmHg Patient Gender: F            HR:           61 bpm. Exam Location:  Inpatient Procedure: 2D Echo, 3D Echo, Cardiac Doppler and Color Doppler Indications:    I26.02 Pulmonary embolus  History:        Patient has no prior history of Echocardiogram examinations.                 COPD, Signs/Symptoms:Chest Pain, Shortness of Breath and                 Dyspnea; Risk Factors:Current Smoker.  Sonographer:    Sheralyn Boatman RDCS Referring Phys: [id number] [facility]  Sonographer Comments: Suboptimal parasternal window. IMPRESSIONS  1. Left ventricular ejection fraction, by estimation, is 55 to 60%. The left ventricle has normal function. The left ventricle has no regional wall motion abnormalities. Left ventricular diastolic parameters were normal.  2. Right ventricular systolic function is normal. The right ventricular size is normal.  There is normal pulmonary artery systolic pressure. The estimated right ventricular systolic pressure is 24.2 mmHg.  3. The mitral valve is normal in structure. Trivial mitral valve regurgitation. No evidence of mitral stenosis.  4. The aortic valve was not well visualized. Aortic valve regurgitation is not visualized. No aortic stenosis is present.  5. The inferior vena cava is normal in size with <50% respiratory variability, suggesting right atrial pressure of 8 mmHg. FINDINGS  Left Ventricle: Left ventricular ejection fraction, by estimation, is 55 to 60%. The left ventricle has normal function. The left ventricle has no regional wall motion abnormalities. The left ventricular internal cavity size was normal in size. There is  no left ventricular hypertrophy. Left ventricular diastolic parameters were normal. Right Ventricle: The right ventricular size is normal. No increase in right ventricular wall thickness. Right ventricular systolic function is normal. There is normal pulmonary artery systolic pressure. The tricuspid regurgitant velocity is 2.01 m/s, and  with an assumed right atrial pressure of 8 mmHg, the estimated right ventricular systolic pressure is 24.2 mmHg. Left Atrium: Left atrial size was normal in size. Right Atrium: Right atrial size was normal in size. Pericardium: There is no evidence of pericardial effusion. Mitral Valve: The mitral valve is normal in structure. Trivial mitral valve regurgitation. No evidence of mitral valve stenosis. Tricuspid Valve: The tricuspid valve is normal in structure. Tricuspid valve regurgitation is trivial. Aortic Valve: The aortic valve was not well visualized. Aortic valve regurgitation is not visualized. No aortic stenosis is present. Pulmonic Valve: The pulmonic valve was not well visualized. Pulmonic valve regurgitation is not visualized. Aorta: The aortic root and ascending aorta are structurally normal, with no evidence of dilitation. Venous: The inferior  vena cava is normal in size with less than 50% respiratory variability, suggesting right atrial pressure of 8 mmHg. IAS/Shunts: The interatrial septum  was not well visualized.  LEFT VENTRICLE PLAX 2D LVIDd:         4.80 cm     Diastology LVIDs:         3.30 cm     LV e' medial:    14.10 cm/s LV PW:         0.80 cm     LV E/e' medial:  6.2 LV IVS:        0.80 cm     LV e' lateral:   13.90 cm/s LVOT diam:     1.80 cm     LV E/e' lateral: 6.3 LV SV:         59 LV SV Index:   35 LVOT Area:     2.54 cm  LV Volumes (MOD) LV vol d, MOD A2C: 96.8 ml LV vol d, MOD A4C: 78.8 ml LV vol s, MOD A2C: 29.8 ml LV vol s, MOD A4C: 27.8 ml LV SV MOD A2C:     67.0 ml LV SV MOD A4C:     78.8 ml LV SV MOD BP:      58.9 ml RIGHT VENTRICLE             IVC RV S prime:     15.20 cm/s  IVC diam: 1.70 cm TAPSE (M-mode): 2.5 cm LEFT ATRIUM             Index        RIGHT ATRIUM          Index LA diam:        2.60 cm 1.54 cm/m   RA Area:     8.07 cm LA Vol (A2C):   26.8 ml 15.89 ml/m  RA Volume:   15.30 ml 9.07 ml/m LA Vol (A4C):   19.2 ml 11.39 ml/m LA Biplane Vol: 23.6 ml 14.00 ml/m  AORTIC VALVE LVOT Vmax:   130.00 cm/s LVOT Vmean:  84.500 cm/s LVOT VTI:    0.231 m  AORTA Ao Root diam: 2.30 cm Ao Asc diam:  2.60 cm MITRAL VALVE               TRICUSPID VALVE MV Area (PHT): 3.91 cm    TR Peak grad:   16.2 mmHg MV Decel Time: 194 msec    TR Vmax:        201.00 cm/s MV E velocity: 87.30 cm/s MV A velocity: 64.00 cm/s  SHUNTS MV E/A ratio:  1.36        Systemic VTI:  0.23 m                            Systemic Diam: 1.80 cm Epifanio Lesches MD Electronically signed by Epifanio Lesches MD Signature Date/Time: 08/31/2021/4:38:51 PM    Final    VAS [place] LOWER EXTREMITY VENOUS (DVT)  Result Date: 08/31/2021  Lower Venous DVT Study Patient Name:  Shahana Christian  Date of Exam:   08/31/2021 Medical Rec #: [id number]     Accession #:    [id number] Date of Birth: [birth year]     Patient Gender: F Patient Age:   85 years Exam Location:  [facility] Procedure:      VAS [place] LOWER EXTREMITY VENOUS (DVT) Referring Phys: Dow Adolph --------------------------------------------------------------------------------  Indications: Pulmonary embolism.  Risk Factors: Confirmed PE. Comparison Study: No prior studies. Performing Technologist: Chanda Busing RVT  Examination Guidelines: A complete evaluation includes B-mode imaging, spectral Doppler, color Doppler, and  power Doppler as needed of all accessible portions of each vessel. Bilateral testing is considered an integral part of a complete examination. Limited examinations for reoccurring indications may be performed as noted. The reflux portion of the exam is performed with the patient in reverse Trendelenburg.  +---------+---------------+---------+-----------+----------+--------------+ RIGHT    CompressibilityPhasicitySpontaneityPropertiesThrombus Aging +---------+---------------+---------+-----------+----------+--------------+ CFV      Full           Yes      Yes                                 +---------+---------------+---------+-----------+----------+--------------+ SFJ      Full                                                        +---------+---------------+---------+-----------+----------+--------------+ FV Prox  Full                                                        +---------+---------------+---------+-----------+----------+--------------+ FV Mid   Full                                                        +---------+---------------+---------+-----------+----------+--------------+ FV DistalFull                                                        +---------+---------------+---------+-----------+----------+--------------+ PFV      Full                                                        +---------+---------------+---------+-----------+----------+--------------+ POP      Full           Yes      Yes                                  +---------+---------------+---------+-----------+----------+--------------+ PTV      Full                                                        +---------+---------------+---------+-----------+----------+--------------+ PERO     Full                                                        +---------+---------------+---------+-----------+----------+--------------+   +---------+---------------+---------+-----------+----------+--------------+  LEFT     CompressibilityPhasicitySpontaneityPropertiesThrombus Aging +---------+---------------+---------+-----------+----------+--------------+ CFV      Full           Yes      Yes                                 +---------+---------------+---------+-----------+----------+--------------+ SFJ      Full                                                        +---------+---------------+---------+-----------+----------+--------------+ FV Prox  Full                                                        +---------+---------------+---------+-----------+----------+--------------+ FV Mid   Full                                                        +---------+---------------+---------+-----------+----------+--------------+ FV DistalFull                                                        +---------+---------------+---------+-----------+----------+--------------+ PFV      Full                                                        +---------+---------------+---------+-----------+----------+--------------+ POP      Full           Yes      Yes                                 +---------+---------------+---------+-----------+----------+--------------+ PTV      Full                                                        +---------+---------------+---------+-----------+----------+--------------+ PERO     Full                                                         +---------+---------------+---------+-----------+----------+--------------+     Summary: RIGHT: - There is no evidence of deep vein thrombosis in the lower extremity.  - No cystic structure found in the popliteal fossa.  LEFT: - There is no evidence of deep vein thrombosis in the lower extremity.  - No  cystic structure found in the popliteal fossa.  *See table(s) above for measurements and observations. Electronically signed by Sherald Hess MD on 08/31/2021 at 1:46:58 PM.    Final     Microbiology: Recent Results (from the past 240 hour(s))  Resp Panel by RT-PCR (Flu A&B, Covid) Nasopharyngeal Swab     Status: None   Collection Time: 08/24/21 12:09 AM   Specimen: Nasopharyngeal Swab; Nasopharyngeal(NP) swabs in vial transport medium  Result Value Ref Range Status   SARS Coronavirus 2 by RT PCR NEGATIVE NEGATIVE Final    Comment: (NOTE) SARS-CoV-2 target nucleic acids are NOT DETECTED.  The SARS-CoV-2 RNA is generally detectable in upper respiratory specimens during the acute phase of infection. The lowest concentration of SARS-CoV-2 viral copies this assay can detect is 138 copies/mL. A negative result does not preclude SARS-Cov-2 infection and should not be used as the sole basis for treatment or other patient management decisions. A negative result may occur with  improper specimen collection/handling, submission of specimen other than nasopharyngeal swab, presence of viral mutation(s) within the areas targeted by this assay, and inadequate number of viral copies(<138 copies/mL). A negative result must be combined with clinical observations, patient history, and epidemiological information. The expected result is Negative.  Fact Sheet for Patients:  BloggerCourse.com  Fact Sheet for Healthcare Providers:  SeriousBroker.it  This test is no t yet approved or cleared by the [place] FDA and  has been authorized for detection  and/or diagnosis of SARS-CoV-2 by FDA under an Emergency Use Authorization (EUA). This EUA will remain  in effect (meaning this test can be used) for the duration of the COVID-19 declaration under Section 564(b)(1) of the Act, 21 U.S.C.section 360bbb-3(b)(1), unless the authorization is terminated  or revoked sooner.       Influenza A by PCR NEGATIVE NEGATIVE Final   Influenza B by PCR NEGATIVE NEGATIVE Final    Comment: (NOTE) The Xpert Xpress SARS-CoV-2/FLU/RSV plus assay is intended as an aid in the diagnosis of influenza from Nasopharyngeal swab specimens and should not be used as a sole basis for treatment. Nasal washings and aspirates are unacceptable for Xpert Xpress SARS-CoV-2/FLU/RSV testing.  Fact Sheet for Patients: BloggerCourse.com  Fact Sheet for Healthcare Providers: SeriousBroker.it  This test is not yet approved or cleared by the [place] FDA and has been authorized for detection and/or diagnosis of SARS-CoV-2 by FDA under an Emergency Use Authorization (EUA). This EUA will remain in effect (meaning this test can be used) for the duration of the COVID-19 declaration under Section 564(b)(1) of the Act, 21 U.S.C. section 360bbb-3(b)(1), unless the authorization is terminated or revoked.  Performed at [facility], [address] [place], [place] [address]   Resp Panel by RT-PCR (Flu A&B, Covid) Nasopharyngeal Swab     Status: None   Collection Time: 08/30/21  9:45 PM   Specimen: Nasopharyngeal Swab; Nasopharyngeal(NP) swabs in vial transport medium  Result Value Ref Range Status   SARS Coronavirus 2 by RT PCR NEGATIVE NEGATIVE Final    Comment: (NOTE) SARS-CoV-2 target nucleic acids are NOT DETECTED.  The SARS-CoV-2 RNA is generally detectable in upper respiratory specimens during the acute phase of infection. The lowest concentration of SARS-CoV-2 viral copies this assay can detect is 138  copies/mL. A negative result does not preclude SARS-Cov-2 infection and should not be used as the sole basis for treatment or other patient management decisions. A negative result may occur with  improper specimen collection/handling, submission of specimen  other than nasopharyngeal swab, presence of viral mutation(s) within the areas targeted by this assay, and inadequate number of viral copies(<138 copies/mL). A negative result must be combined with clinical observations, patient history, and epidemiological information. The expected result is Negative.  Fact Sheet for Patients:  BloggerCourse.com  Fact Sheet for Healthcare Providers:  SeriousBroker.it  This test is no t yet approved or cleared by the [place] FDA and  has been authorized for detection and/or diagnosis of SARS-CoV-2 by FDA under an Emergency Use Authorization (EUA). This EUA will remain  in effect (meaning this test can be used) for the duration of the COVID-19 declaration under Section 564(b)(1) of the Act, 21 U.S.C.section 360bbb-3(b)(1), unless the authorization is terminated  or revoked sooner.       Influenza A by PCR NEGATIVE NEGATIVE Final   Influenza B by PCR NEGATIVE NEGATIVE Final    Comment: (NOTE) The Xpert Xpress SARS-CoV-2/FLU/RSV plus assay is intended as an aid in the diagnosis of influenza from Nasopharyngeal swab specimens and should not be used as a sole basis for treatment. Nasal washings and aspirates are unacceptable for Xpert Xpress SARS-CoV-2/FLU/RSV testing.  Fact Sheet for Patients: BloggerCourse.com  Fact Sheet for Healthcare Providers: SeriousBroker.it  This test is not yet approved or cleared by the [place] FDA and has been authorized for detection and/or diagnosis of SARS-CoV-2 by FDA under an Emergency Use Authorization (EUA). This EUA will remain in effect (meaning  this test can be used) for the duration of the COVID-19 declaration under Section 564(b)(1) of the Act, 21 U.S.C. section 360bbb-3(b)(1), unless the authorization is terminated or revoked.  Performed at [facility], [address], [place], [place] [address]      Labs: Basic Metabolic Panel: Recent Labs  Lab 08/30/21 0919 08/31/21 0646  NA 135 133*  K 3.7 4.1  CL 106 106  CO2 20* 24  GLUCOSE 100* 86  BUN 29* 17  CREATININE 0.78 0.71  CALCIUM 9.1 8.3*  MG  --  2.0  PHOS  --  3.5   Liver Function Tests: Recent Labs  Lab 08/30/21 0919 08/31/21 0646  AST 25 29  ALT 15 15  ALKPHOS 72 57  BILITOT 1.2 0.9  PROT 7.9 6.5  ALBUMIN 4.8 3.6   No results for input(s): LIPASE, AMYLASE in the last 168 hours. No results for input(s): AMMONIA in the last 168 hours. CBC: Recent Labs  Lab 08/30/21 0919 08/31/21 0646 09/01/21 0618  WBC 11.7* 6.9 6.9  HGB 11.4* 10.0* 9.6*  HCT 35.5* 31.9* 29.8*  MCV 70.9* 72.2* 71.5*  PLT 317 247 232   Cardiac Enzymes: No results for input(s): CKTOTAL, CKMB, CKMBINDEX, TROPONINI in the last 168 hours. BNP: BNP (last 3 results) No results for input(s): BNP in the last 8760 hours.  ProBNP (last 3 results) No results for input(s): PROBNP in the last 8760 hours.  CBG: No results for input(s): GLUCAP in the last 168 hours.     Signed:  Darlin Drop, MD [facility] 09/01/2021, 7:30 PM

## 2021-09-01 NOTE — Progress Notes (Signed)
ANTICOAGULATION CONSULT NOTE - Initial Consult  Pharmacy Consult for Rivaroxaban Indication: pulmonary embolus  Allergies  Allergen Reactions   Pork-Derived Products     Patient Measurements: Height: 5\' 4"  (162.6 cm) Weight: 64 kg (141 lb) IBW/kg (Calculated) : 54.7  Vital Signs: Temp: 98.9 F (37.2 C) (12/06 0540) Temp Source: Oral (12/06 0540) BP: 85/51 (12/06 0540) Pulse Rate: 61 (12/06 0540)  Labs: Recent Labs    08/30/21 0919 08/30/21 1857 08/31/21 0646 09/01/21 0618  HGB 11.4*  --  10.0* 9.6*  HCT 35.5*  --  31.9* 29.8*  PLT 317  --  247 232  CREATININE 0.78  --  0.71  --   TROPONINIHS  --  4  --   --      Estimated Creatinine Clearance: 79.9 mL/min (by C-G formula based on SCr of 0.71 mg/dL).   Medical History: History reviewed. No pertinent past medical history.  Medications:  Scheduled:   diazepam  10 mg Oral BID   ferrous sulfate  325 mg Oral Q breakfast   folic acid  1 mg Oral Daily   ipratropium-albuterol  3 mL Nebulization BID   multivitamin with minerals  1 tablet Oral Daily   nicotine  14 mg Transdermal Daily   Rivaroxaban  15 mg Oral BID WC   Followed by   14/06/22 ON 09/22/2021] rivaroxaban  20 mg Oral Q supper   thiamine  100 mg Oral Daily   Or   thiamine  100 mg Intravenous Daily   Infusions:   sodium chloride Stopped (08/31/21 0623)   PRN: acetaminophen, albuterol, guaiFENesin, LORazepam **OR** LORazepam, traZODone  Assessment: Patient is a 66 yoF admitted with SOB. CTA revealed multiple acute and subacute PE. Patient was started on therapeutic LMWH on admission, pharmacy consulted to transition to rivaroxaban.   Today, 09/01/21 CBC: Hgb low and slightly decreased. Noted pt on menstrual cycle per MD notes. Plt WNL & stable Last enoxaparin dose charted 12/5 @ 2131 SCr WNL, CrCl ~ 80 mL/min   Plan:  Discontinue enoxaparin Rivaroxaban 15 mg PO BID with meals x21 days followed by rivaroxaban 20 mg PO daily with supper CBC with AM  labs tomorrow Patient aggressive, security called. Unable to educate. Manufacturer coupon and booklet provided with discharge paperwork.   [id number], PharmD 09/01/2021,8:56 AM

## 2021-09-10 ENCOUNTER — Other Ambulatory Visit (HOSPITAL_COMMUNITY): Payer: Self-pay

## 2022-11-13 IMAGING — CR DG CHEST 2V
2 series · 2 of 2 positions shown · non-contrast
Comparison: 08/24/2021

CLINICAL DATA: Shortness of breath for 1 hour

EXAM:
CHEST - 2 VIEW

[w chest pa]
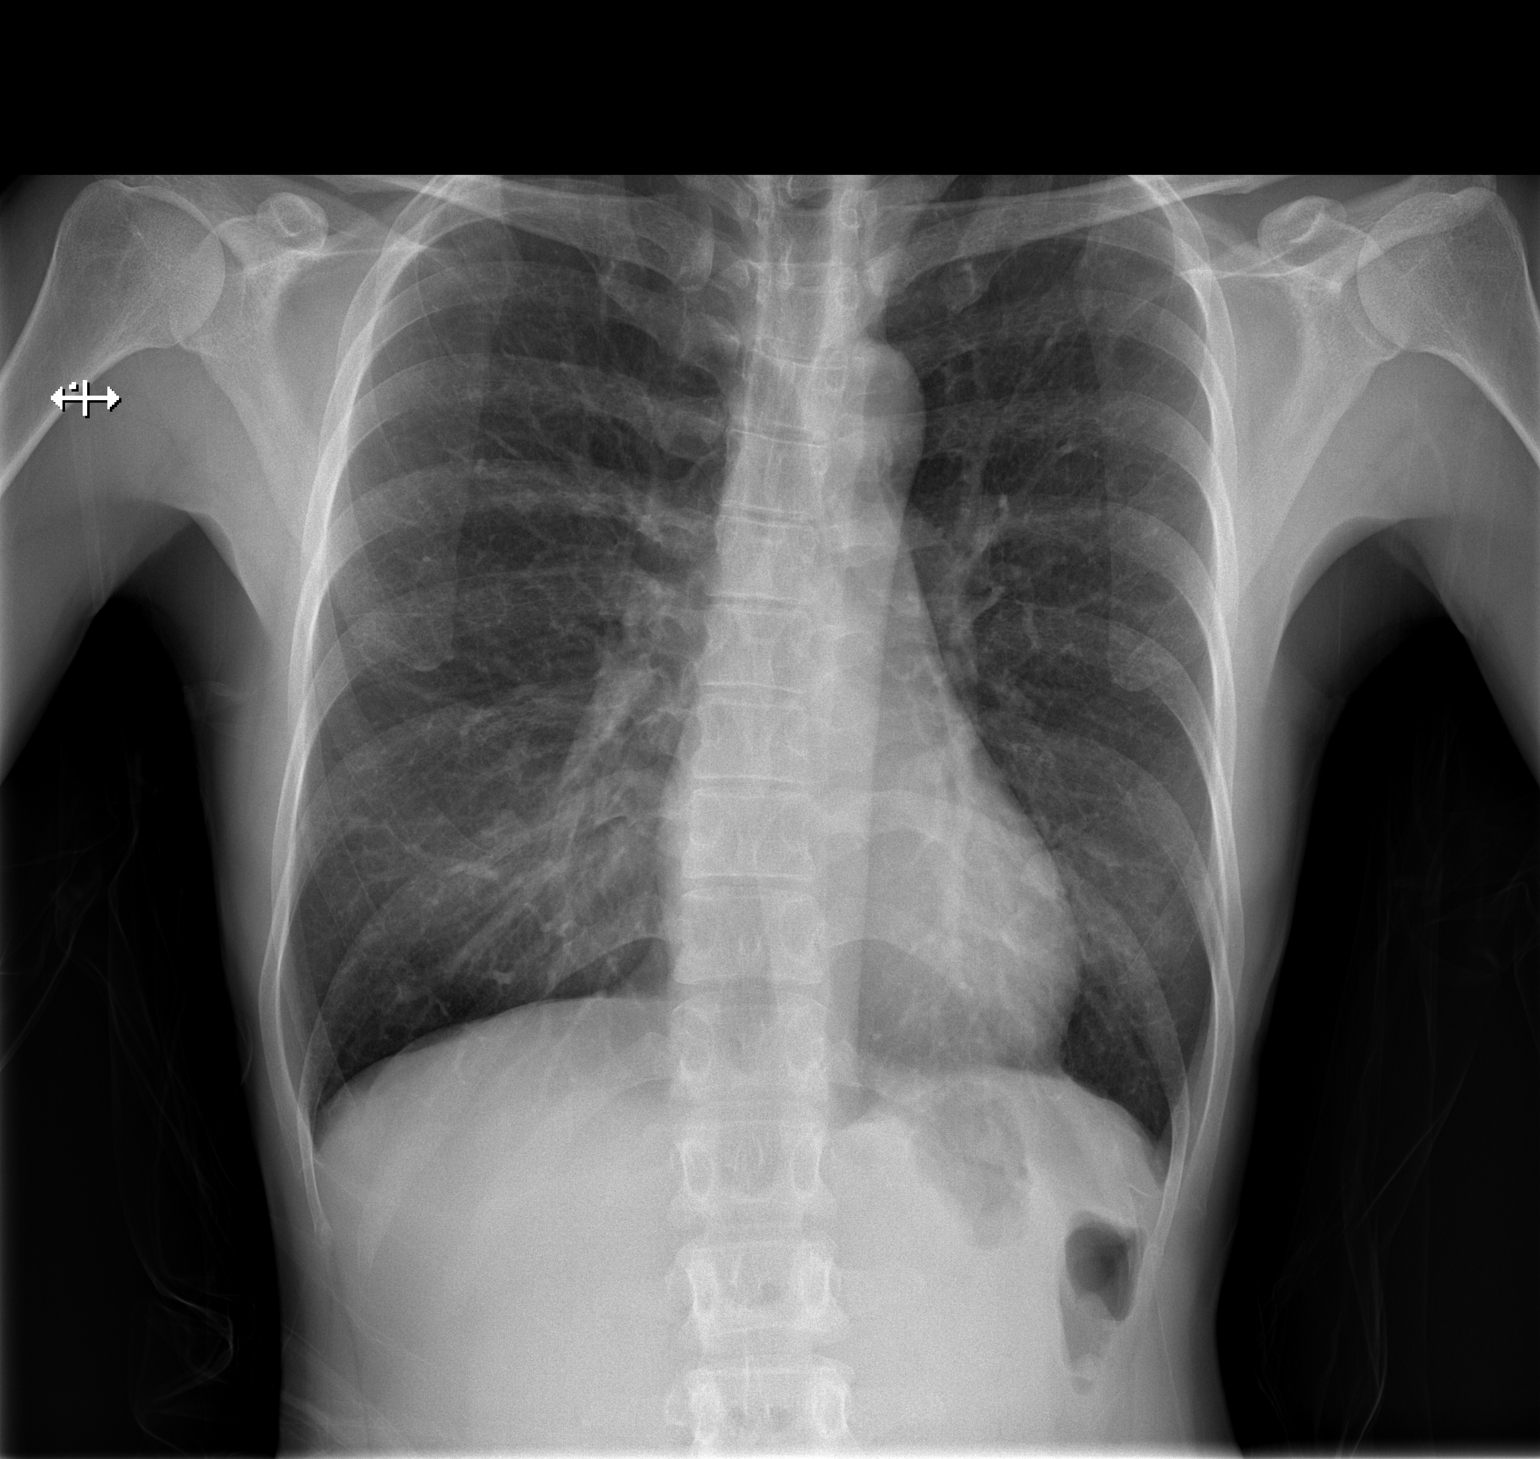

[w chest lat]
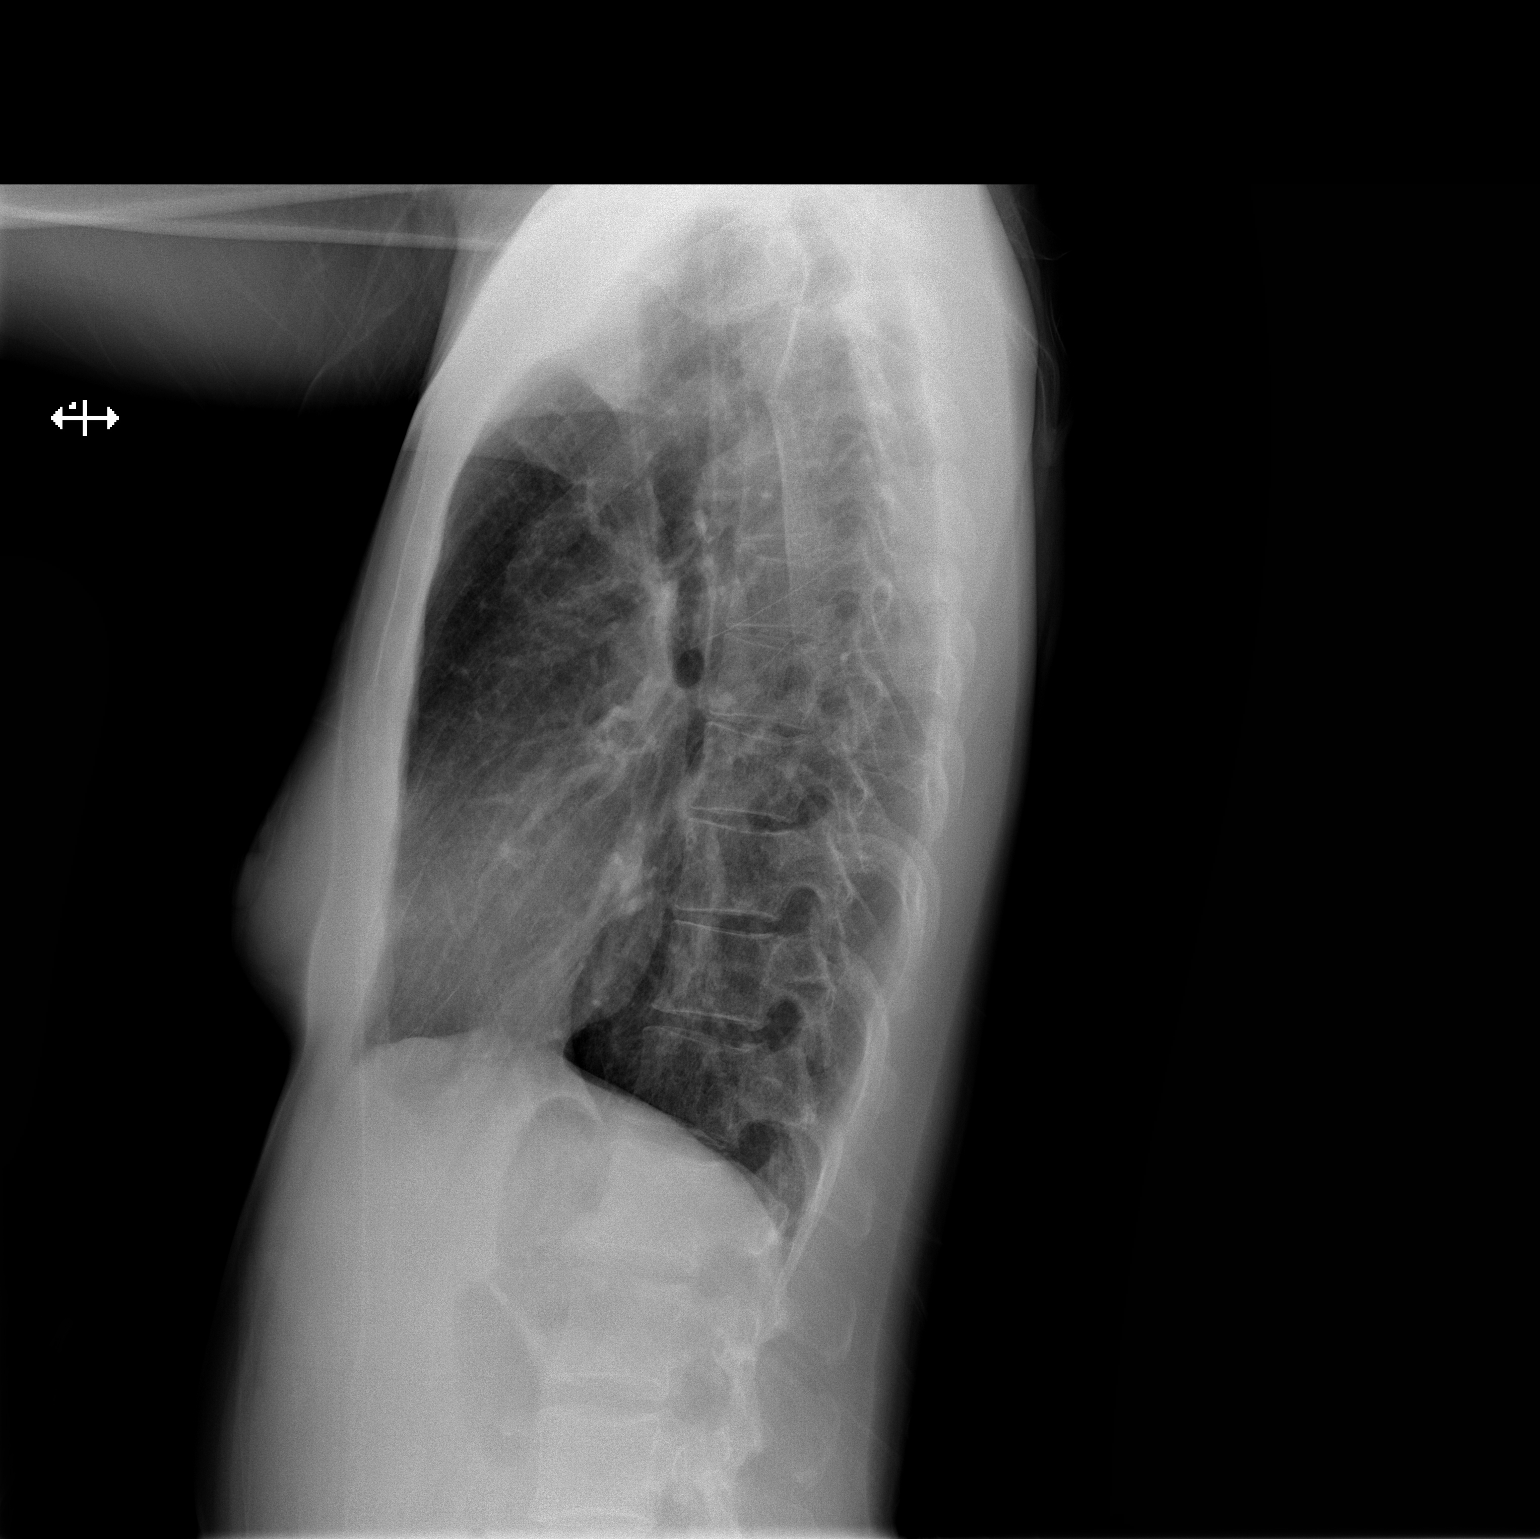

[2 of 2 positions shown; findings below may reference images not displayed]

FINDINGS: The heart size and mediastinal contours are within normal limits.
Both lungs are hyperinflated but clear. The visualized skeletal
structures are unremarkable.
IMPRESSION: COPD without acute abnormality.
